# Patient Record
Sex: Male | Born: 1985 | Race: White | Hispanic: No | Marital: Single | Smoking: Former smoker
Health system: Southern US, Community
[De-identification: ages and names within clinical notes are randomized; demographics above are authoritative.]

## PROBLEM LIST (undated history)

## (undated) DIAGNOSIS — N289 Disorder of kidney and ureter, unspecified: Secondary | ICD-10-CM

---

## 2008-08-06 ENCOUNTER — Ambulatory Visit: Payer: Self-pay | Admitting: Internal Medicine

## 2008-08-09 ENCOUNTER — Ambulatory Visit: Payer: Self-pay | Admitting: Internal Medicine

## 2008-08-15 ENCOUNTER — Ambulatory Visit: Payer: Self-pay | Admitting: Internal Medicine

## 2008-08-21 ENCOUNTER — Encounter: Payer: Self-pay | Admitting: Internal Medicine

## 2008-08-25 ENCOUNTER — Ambulatory Visit: Payer: Self-pay | Admitting: Internal Medicine

## 2008-08-25 ENCOUNTER — Emergency Department: Payer: Self-pay | Admitting: Emergency Medicine

## 2008-09-22 ENCOUNTER — Ambulatory Visit: Payer: Self-pay | Admitting: Urology

## 2008-10-01 ENCOUNTER — Ambulatory Visit: Payer: Self-pay | Admitting: Internal Medicine

## 2008-10-02 ENCOUNTER — Emergency Department: Payer: Self-pay | Admitting: Emergency Medicine

## 2008-10-11 ENCOUNTER — Ambulatory Visit: Payer: Self-pay | Admitting: Internal Medicine

## 2009-03-25 ENCOUNTER — Emergency Department: Payer: Self-pay | Admitting: Emergency Medicine

## 2011-10-22 ENCOUNTER — Emergency Department: Payer: Self-pay | Admitting: Emergency Medicine

## 2011-10-22 LAB — BASIC METABOLIC PANEL
Calcium, Total: 9.4 mg/dL (ref 8.5–10.1)
Chloride: 103 mmol/L (ref 98–107)
Co2: 29 mmol/L (ref 21–32)
Creatinine: 1.13 mg/dL (ref 0.60–1.30)
EGFR (African American): >60
EGFR (Non-African Amer.): >60
Potassium: 3.6 mmol/L (ref 3.5–5.1)
Sodium: 139 mmol/L (ref 136–145)

## 2011-10-22 LAB — URINALYSIS, COMPLETE
Bacteria: NONE SEEN
Bilirubin,UR: NEGATIVE
Glucose,UR: NEGATIVE mg/dL (ref 0–75)
Leukocyte Esterase: NEGATIVE
Specific Gravity: 1.013 (ref 1.003–1.030)
Squamous Epithelial: NONE SEEN
WBC UR: 1 /HPF (ref 0–5)

## 2011-10-22 LAB — CBC
HCT: 46.6 % (ref 40.0–52.0)
HGB: 16.3 g/dL (ref 13.0–18.0)
RBC: 4.94 10*6/uL (ref 4.40–5.90)

## 2011-11-10 ENCOUNTER — Emergency Department: Payer: Self-pay | Admitting: Emergency Medicine

## 2011-11-10 LAB — URINALYSIS, COMPLETE
Bacteria: NONE SEEN
Glucose,UR: NEGATIVE mg/dL (ref 0–75)
Hyaline Cast: 3
Ketone: NEGATIVE
Nitrite: NEGATIVE
Ph: 7 (ref 4.5–8.0)
Specific Gravity: 1.012 (ref 1.003–1.030)
Squamous Epithelial: NONE SEEN

## 2011-11-10 LAB — COMPREHENSIVE METABOLIC PANEL
Anion Gap: 10 (ref 7–16)
BUN: 11 mg/dL (ref 7–18)
Bilirubin,Total: 0.5 mg/dL (ref 0.2–1.0)
Calcium, Total: 9.5 mg/dL (ref 8.5–10.1)
Chloride: 105 mmol/L (ref 98–107)
Co2: 25 mmol/L (ref 21–32)
EGFR (African American): >60
EGFR (Non-African Amer.): >60
Potassium: 3.7 mmol/L (ref 3.5–5.1)
SGOT(AST): 27 U/L (ref 15–37)
SGPT (ALT): 27 U/L (ref 12–78)

## 2011-11-10 LAB — CBC
HCT: 44.4 % (ref 40.0–52.0)
HGB: 15.5 g/dL (ref 13.0–18.0)
MCH: 32.7 pg (ref 26.0–34.0)
MCHC: 34.9 g/dL (ref 32.0–36.0)
MCV: 94 fL (ref 80–100)
RBC: 4.73 10*6/uL (ref 4.40–5.90)
RDW: 13.4 % (ref 11.5–14.5)

## 2011-11-27 ENCOUNTER — Emergency Department: Payer: Self-pay | Admitting: Emergency Medicine

## 2011-11-27 LAB — CBC WITH DIFFERENTIAL/PLATELET
Basophil #: 0 10*3/uL (ref 0.0–0.1)
Eosinophil #: 0.1 10*3/uL (ref 0.0–0.7)
HCT: 42.8 % (ref 40.0–52.0)
HGB: 15.2 g/dL (ref 13.0–18.0)
MCV: 94 fL (ref 80–100)
Monocyte #: 0.6 x10 3/mm (ref 0.2–1.0)
Neutrophil %: 83.8 %
RBC: 4.56 10*6/uL (ref 4.40–5.90)
RDW: 13.2 % (ref 11.5–14.5)
WBC: 8.7 10*3/uL (ref 3.8–10.6)

## 2011-11-27 LAB — URINALYSIS, COMPLETE
Bilirubin,UR: NEGATIVE
Blood: NEGATIVE
Leukocyte Esterase: NEGATIVE
Nitrite: NEGATIVE
Ph: 9 (ref 4.5–8.0)
Protein: NEGATIVE
RBC,UR: 6 /HPF (ref 0–5)
Squamous Epithelial: <1

## 2011-11-27 LAB — BASIC METABOLIC PANEL
Anion Gap: 11 (ref 7–16)
BUN: 12 mg/dL (ref 7–18)
Co2: 21 mmol/L (ref 21–32)
Creatinine: 1.18 mg/dL (ref 0.60–1.30)
EGFR (African American): >60
Glucose: 112 mg/dL — ABNORMAL HIGH (ref 65–99)
Sodium: 138 mmol/L (ref 136–145)

## 2013-05-23 ENCOUNTER — Emergency Department: Payer: Self-pay | Admitting: Emergency Medicine

## 2014-05-01 ENCOUNTER — Ambulatory Visit
Admit: 2014-05-01 | Disposition: A | Discharge status: Home / Self Care | Payer: Self-pay | Attending: Urology | Admitting: Urology

## 2015-11-19 ENCOUNTER — Encounter: Payer: Self-pay | Admitting: *Deleted

## 2015-11-19 ENCOUNTER — Ambulatory Visit
Admission: EM | Admit: 2015-11-19 | Discharge: 2015-11-19 | Disposition: A | Discharge status: Home / Self Care | Payer: Managed Care, Other (non HMO) | Attending: Family Medicine | Admitting: Family Medicine

## 2015-11-19 DIAGNOSIS — B029 Zoster without complications: Secondary | ICD-10-CM

## 2015-11-19 HISTORY — DX: Disorder of kidney and ureter, unspecified: N28.9

## 2015-11-19 MED ORDER — FAMCICLOVIR 500 MG PO TABS: 500.0000 mg | ORAL_TABLET | Freq: Three times a day (TID) | ORAL | 0 refills | Status: DC

## 2015-11-19 MED ORDER — HYDROCODONE-ACETAMINOPHEN 5-325 MG PO TABS: 1.0000 | ORAL_TABLET | Freq: Four times a day (QID) | ORAL | 0 refills | Status: AC | PRN

## 2015-11-19 MED ORDER — FAMCICLOVIR 500 MG PO TABS: 500.0000 mg | ORAL_TABLET | Freq: Three times a day (TID) | ORAL | 0 refills | Status: AC

## 2015-11-19 NOTE — ED Triage Notes (Signed)
Rash extending from spine around left rib margin x1 week. Pt c/o pain and tenderness to area.

## 2015-11-19 NOTE — ED Provider Notes (Signed)
CSN: [id number]     Arrival date & time 11/19/15  1502 History   First MD Initiated Contact with Patient 11/19/15 1658     Chief Complaint  Patient presents with  . Rash   (Consider location/radiation/quality/duration/timing/severity/associated sxs/prior Treatment) HPI  This a 30 year old male who presents with a painful rash that extends from his thoracic spine at about the T12 level around to the posterior axillary line. There is only several lesions present that have already scabbed over. He states that about a week ago he began to have a sunburn type burning pain. Last night the pain became so severe that he was requesting his wife take him to the emergency room. The area is most painful when he first awakens in the morning and also upon recumbency at night. During the daytime he knows that the pain is present but is not as severe. He is able to control the pain with BC powders during the day. He has not been under any undue stress. He has not had shingles in the past.      Past Medical History:  Diagnosis Date  . Renal disorder    History reviewed. No pertinent surgical history. History reviewed. No pertinent family history. Social History  Substance Use Topics  . Smoking status: Former Games developermoker  . Smokeless tobacco: Former NeurosurgeonUser  . Alcohol use Yes    Review of Systems  Constitutional: Negative for activity change, appetite change, chills, fatigue and fever.  Skin: Positive for rash.  All other systems reviewed and are negative.   Allergies  Erythromycin  Home Medications   Prior to Admission medications   Medication Sig Start Date End Date Taking? Authorizing Provider  famciclovir (FAMVIR) 500 MG tablet Take 1 tablet (500 mg total) by mouth 3 (three) times daily. 11/19/15   Lutricia FeilWilliam P Camika Marsico, PA-C  HYDROcodone-acetaminophen (NORCO/VICODIN) 5-325 MG tablet Take 1-2 tablets by mouth every 6 (six) hours as needed. 11/19/15   Lutricia FeilWilliam P Analeese Andreatta, PA-C   Meds Ordered and  Administered this Visit  Medications - No data to display  BP 107/82 (BP Location: Left Arm)   Pulse 71   Temp 98.3 F (36.8 C) (Oral)   Resp 16   Ht 5\' 7"  (1.702 m)   Wt 125 lb (56.7 kg)   SpO2 100%   BMI 19.58 kg/m  No data found.   Physical Exam  Constitutional: He is oriented to person, place, and time. He appears well-developed and well-nourished. No distress.  HENT:  Head: Normocephalic and atraumatic.  Right Ear: External ear normal.  Left Ear: External ear normal.  Eyes: EOM are normal. Pupils are equal, round, and reactive to light.  Neck: Normal range of motion. Neck supple.  Pulmonary/Chest: Effort normal and breath sounds normal. No respiratory distress. He has no wheezes. He has no rales.  Musculoskeletal: Normal range of motion.  Lymphadenopathy:    He has no cervical adenopathy.  Neurological: He is alert and oriented to person, place, and time.  Skin: Skin is warm and dry. He is not diaphoretic.  Examination of his posterior chest wall shows several millimeter circular lesions in groups of 2 that have scabbed over the top. There is a mildly erythematous base. It is in a line from the T12 extending over that his posterior left back extending out to the posterior axillary line. There is no vesicles present and no excoriations.  Psychiatric: He has a normal mood and affect. His behavior is normal. Judgment and thought content normal.  Nursing note and vitals reviewed.   Urgent Care Course   Clinical Course     Procedures (including critical care time)  Labs Review Labs Reviewed - No data to display  Imaging Review No results found.   Visual Acuity Review  Right Eye Distance:   Left Eye Distance:   Bilateral Distance:    Right Eye Near:   Left Eye Near:    Bilateral Near:         MDM   1. Herpes zoster without complication    Discharge Medication List as of 11/19/2015  5:30 PM    START taking these medications   Details   HYDROcodone-acetaminophen (NORCO/VICODIN) 5-325 MG tablet Take 1-2 tablets by mouth every 6 (six) hours as needed., Starting Mon 11/19/2015, Print      Plan: 1. Test/x-ray results and diagnosis reviewed with patient 2. rx as per orders; risks, benefits, potential side effects reviewed with patient 3. Recommend supportive treatment with  not to allow his 3517-month-old to come in contact until they are completely healed. I told him that it is late starting him on an antiviral but I think it is worth a try to prevent postherpetic neuralgia  risk. He has has severe pain at nighttime I will also provide him with Vicodin for pain at nighttime. [facility] in the daytime or may use ibuprofen 600 mg 3 times a day with food. He is not improving he should follow-up with his primary care physician 4. F/u prn if symptoms worsen or don't improve     Lutricia FeilWilliam P Delmi Fulfer, PA-C 11/19/15 1742

## 2016-01-31 IMAGING — CT CT STONE STUDY
2 of 4 series · 17 of 46 positions shown, 19 images · non-contrast
Comparison: 11/27/2011

CLINICAL DATA: Right side renal calculi, right lower quadrant
abdominal pain for 2 months

EXAM:
CT ABDOMEN AND PELVIS WITHOUT CONTRAST
TECHNIQUE: Multidetector CT imaging of the abdomen and pelvis was performed
following the standard protocol without IV contrast.

[Series 2: soft tissue · axial · 0.61mm/px · z∈[-1044,-644]mm · 14 of 88 slices shown, 16 images]
[im 4/88  soft-tissue]
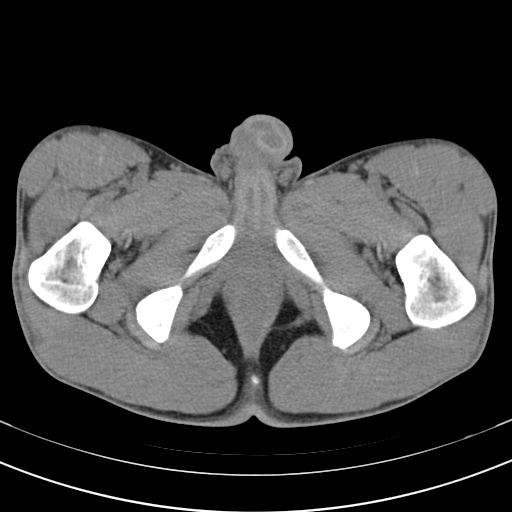
[im 4/88  bone]
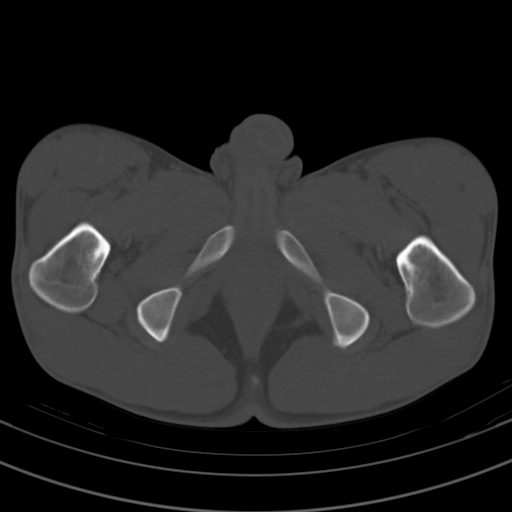
[im 12/88  soft-tissue]
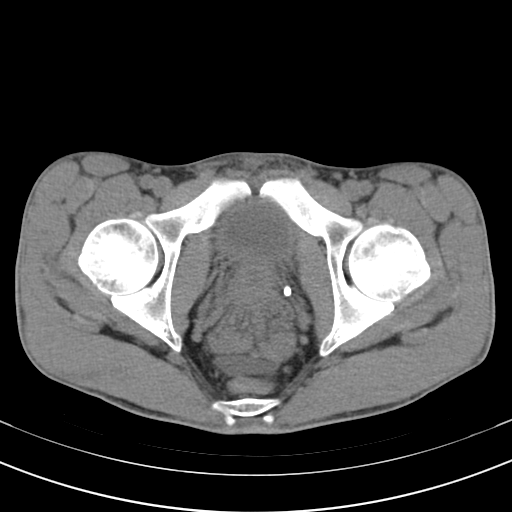
[im 16/88  soft-tissue]
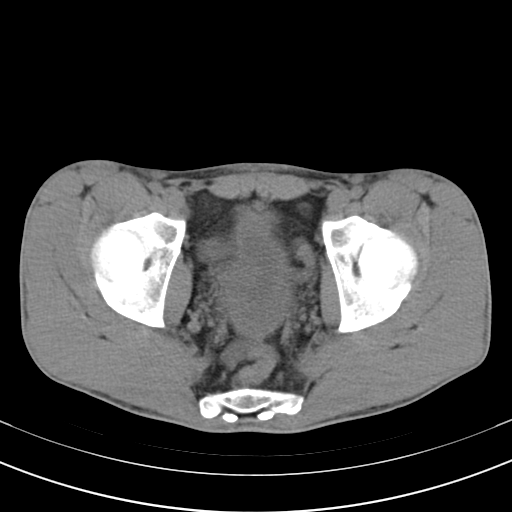
[im 23/88  soft-tissue]
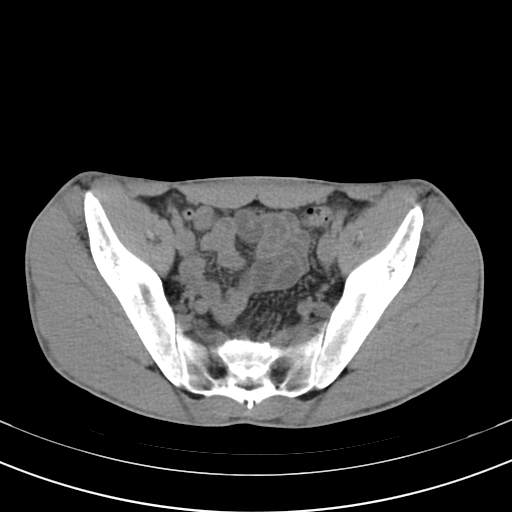
[im 31/88  soft-tissue]
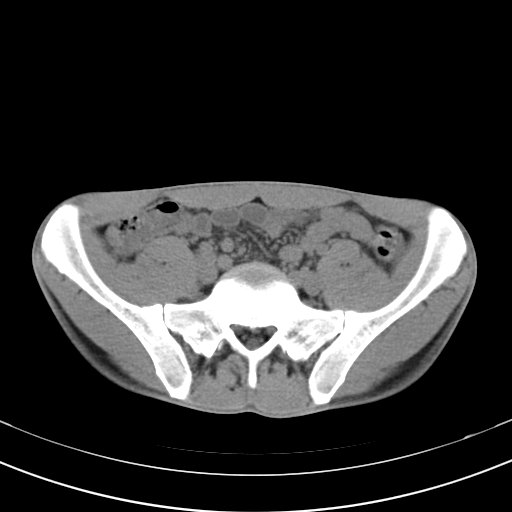
[im 35/88  soft-tissue]
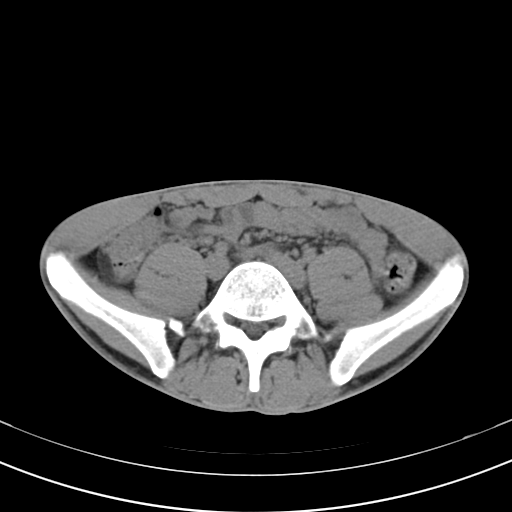
[im 42/88  soft-tissue]
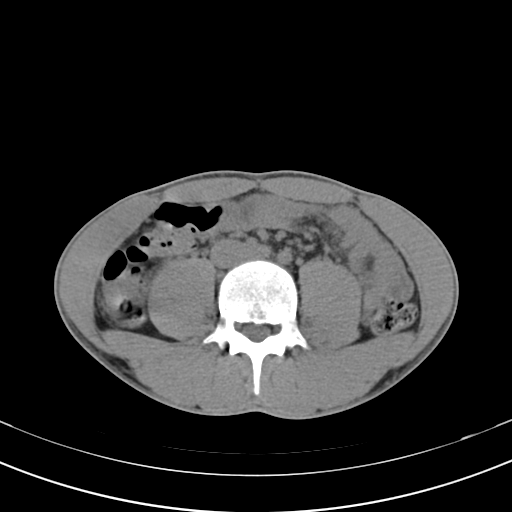
[im 46/88  soft-tissue]
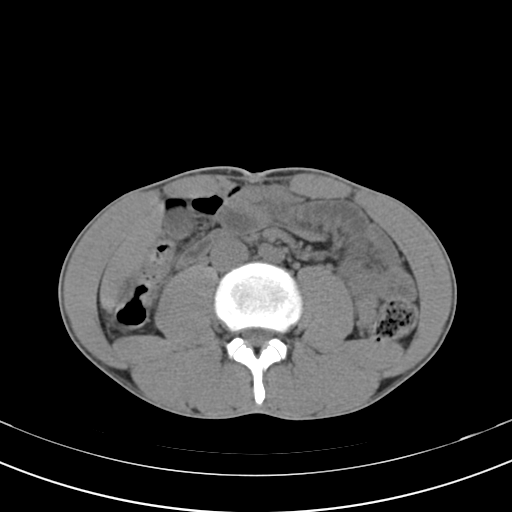
[im 53/88  soft-tissue]
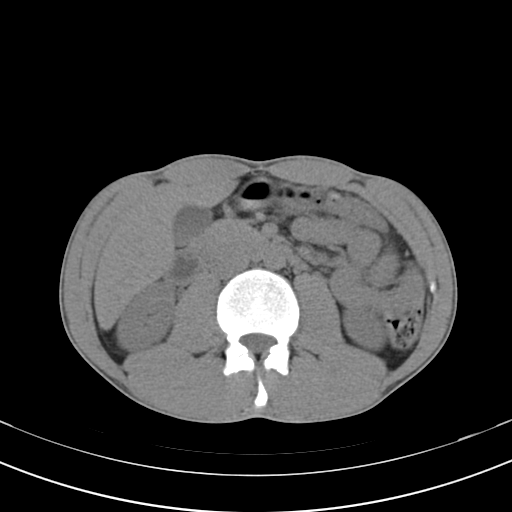
[im 53/88  bone]
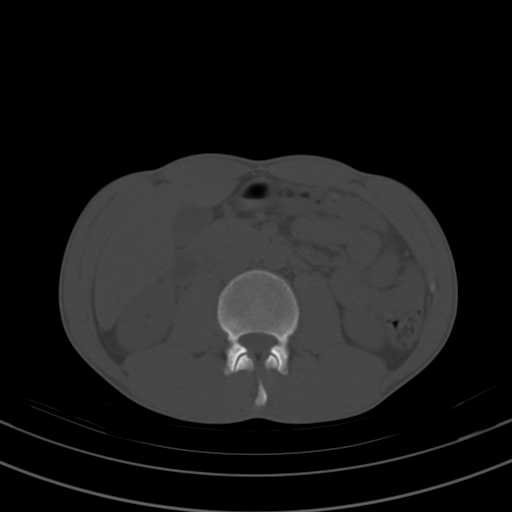
[im 57/88  soft-tissue]
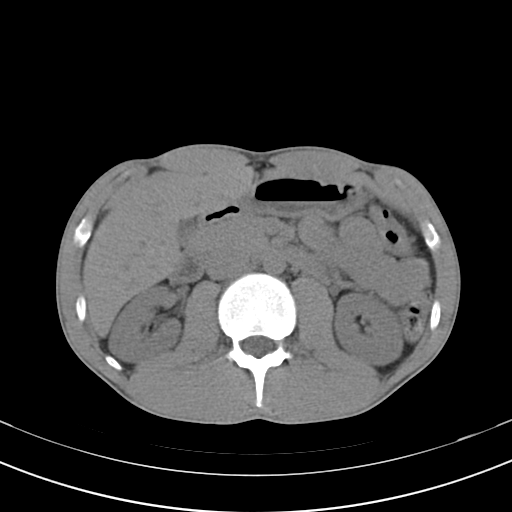
[im 65/88  soft-tissue]
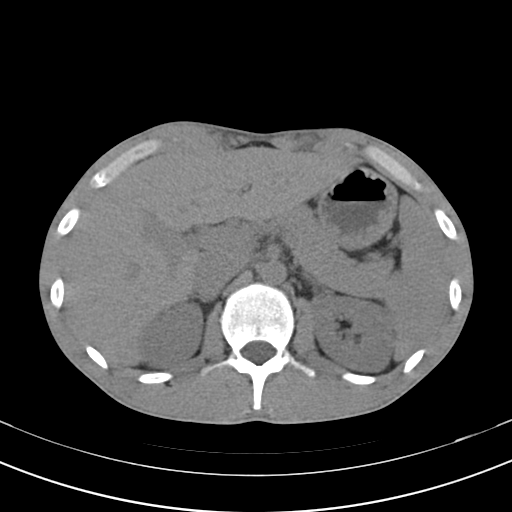
[im 72/88  soft-tissue]
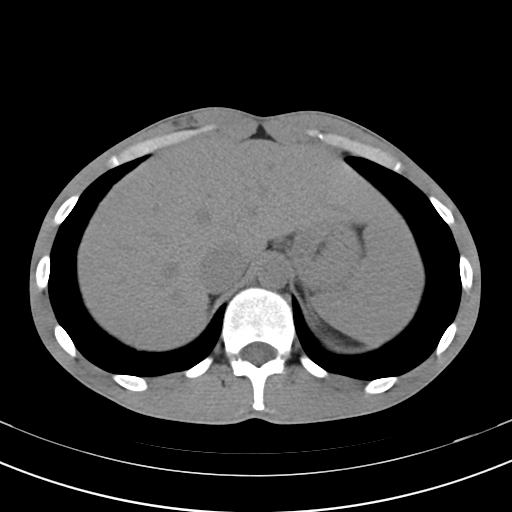
[im 76/88  soft-tissue]
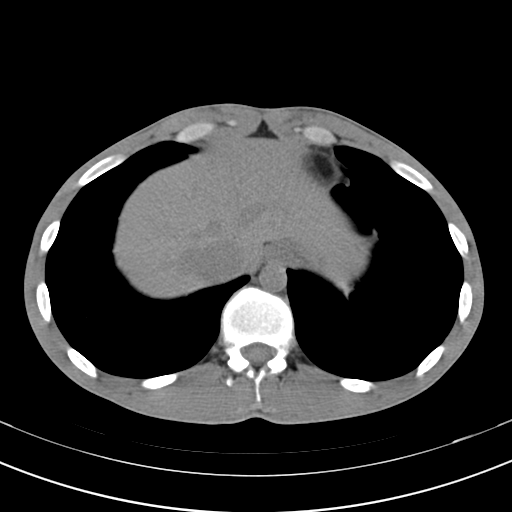
[im 84/88  soft-tissue]
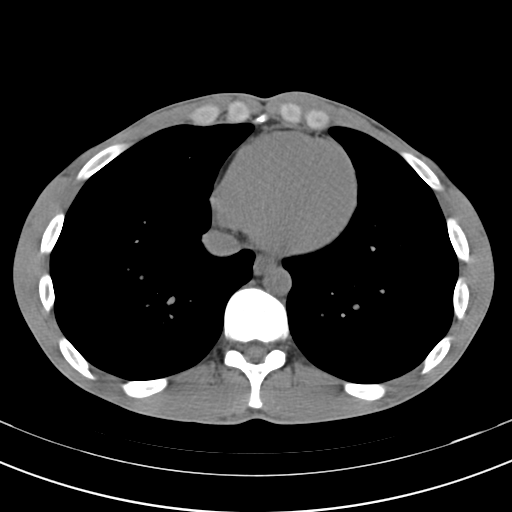

[Series 602: coronal · coronal · 0.85mm/px · 3 of 80 slices shown]
[im 27/80  soft-tissue]
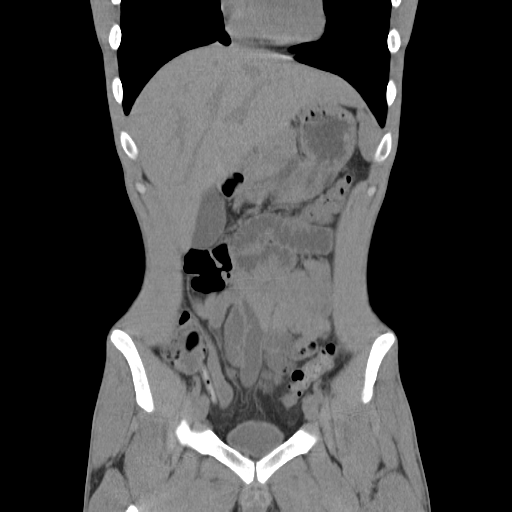
[im 36/80  soft-tissue]
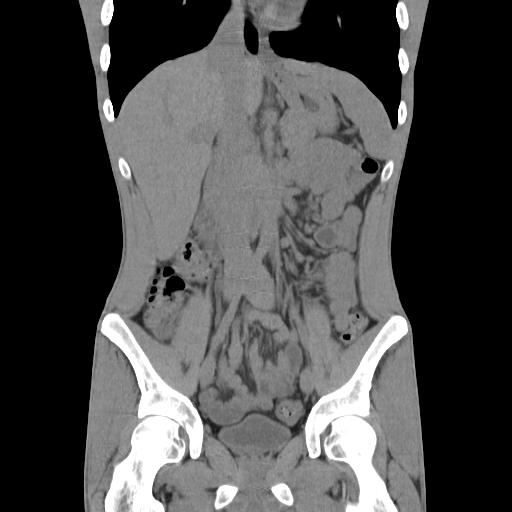
[im 44/80  soft-tissue]
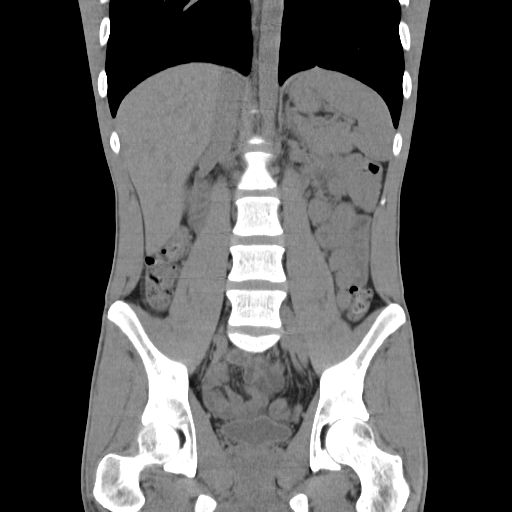

[17 of 46 positions shown; findings below may reference images not displayed]

FINDINGS: Lower chest:  Lung bases are clear.

Hepatobiliary: Unenhanced liver is unremarkable. Gallbladder appears
normal.

Pancreas: Normal

Spleen: Granulomatous calcifications are noted within the otherwise
normal-appearing spleen.

Adrenals/Urinary Tract: Numerous 1-2 mm nonobstructing bilateral
radiopaque renal calculi are identified without
hydroureteronephrosis. No radiopaque ureteral or bladder calculus.
No perinephric fluid or stranding.

Stomach/Bowel: No bowel wall thickening or focal segmental
dilatation is identified. Stomach appears normal. Appendix is
normal.

Vascular/Lymphatic: No aortic aneurysm.  No lymphadenopathy.

Other: Lobulated fluid density structure within the pelvis is again
noted which could represent a small amount of free fluid which is
generally considered abnormal in a male patient but may occasionally
be seen in the absence of trauma or other acute abnormalities. This
may indicate occult bowel pathology. Alternatively, this could
represent enteric duplication cyst or possibly lymphangioma given
chronicity of findings.

Musculoskeletal: No acute osseous abnormality.
IMPRESSION: No acute intra-abdominal or pelvic pathology.

Bilateral 1-2 mm nonobstructing renal calculi again identified.

## 2022-12-02 ENCOUNTER — Emergency Department
Admission: EM | Admit: 2022-12-02 | Discharge: 2022-12-02 | Disposition: A | Discharge status: Home / Self Care | Payer: Managed Care, Other (non HMO) | Attending: Emergency Medicine | Admitting: Emergency Medicine

## 2022-12-02 ENCOUNTER — Other Ambulatory Visit: Payer: Self-pay

## 2022-12-02 DIAGNOSIS — F489 Nonpsychotic mental disorder, unspecified: Secondary | ICD-10-CM

## 2022-12-02 DIAGNOSIS — F439 Reaction to severe stress, unspecified: Secondary | ICD-10-CM | POA: Insufficient documentation

## 2022-12-02 DIAGNOSIS — F419 Anxiety disorder, unspecified: Secondary | ICD-10-CM | POA: Insufficient documentation

## 2022-12-02 LAB — CBC
HCT: 44.4 % (ref 39.0–52.0)
Hemoglobin: 15.7 g/dL (ref 13.0–17.0)
MCH: 33.1 pg (ref 26.0–34.0)
MCHC: 35.4 g/dL (ref 30.0–36.0)
MCV: 93.5 fL (ref 80.0–100.0)
Platelets: 255 10*3/uL (ref 150–400)
RBC: 4.75 MIL/uL (ref 4.22–5.81)
RDW: 12.4 % (ref 11.5–15.5)
WBC: 10.2 10*3/uL (ref 4.0–10.5)
nRBC: 0 % (ref 0.0–0.2)

## 2022-12-02 LAB — COMPREHENSIVE METABOLIC PANEL
ALT: 19 U/L (ref 0–44)
AST: 22 U/L (ref 15–41)
Albumin: 4.8 g/dL (ref 3.5–5.0)
Alkaline Phosphatase: 61 U/L (ref 38–126)
Anion gap: 10 (ref 5–15)
BUN: 10 mg/dL (ref 6–20)
CO2: 28 mmol/L (ref 22–32)
Calcium: 9.6 mg/dL (ref 8.9–10.3)
Chloride: 102 mmol/L (ref 98–111)
Creatinine, Ser: 0.98 mg/dL (ref 0.61–1.24)
GFR, Estimated: >60 mL/min (ref >60)
Glucose, Bld: 95 mg/dL (ref 70–99)
Potassium: 3.5 mmol/L (ref 3.5–5.1)
Sodium: 140 mmol/L (ref 135–145)
Total Bilirubin: 1.1 mg/dL (ref <1.2)
Total Protein: 7.6 g/dL (ref 6.5–8.1)

## 2022-12-02 LAB — URINE DRUG SCREEN, QUALITATIVE (ARMC ONLY)
Amphetamines, Ur Screen: NOT DETECTED
Barbiturates, Ur Screen: NOT DETECTED
Benzodiazepine, Ur Scrn: NOT DETECTED
Cannabinoid 50 Ng, Ur ~~LOC~~: POSITIVE — AB
Cocaine Metabolite,Ur ~~LOC~~: NOT DETECTED
MDMA (Ecstasy)Ur Screen: NOT DETECTED
Methadone Scn, Ur: NOT DETECTED
Opiate, Ur Screen: NOT DETECTED
Phencyclidine (PCP) Ur S: NOT DETECTED
Tricyclic, Ur Screen: NOT DETECTED

## 2022-12-02 LAB — SALICYLATE LEVEL: Salicylate Lvl: <7 mg/dL — ABNORMAL LOW (ref 7.0–30.0)

## 2022-12-02 LAB — ACETAMINOPHEN LEVEL: Acetaminophen (Tylenol), Serum: <10 ug/mL — ABNORMAL LOW (ref 10–30)

## 2022-12-02 LAB — ETHANOL: Alcohol, Ethyl (B): <10 mg/dL (ref <10)
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
## 2022-12-02 NOTE — Discharge Instructions (Addendum)
You have been seen in the emergency department for a  psychiatric concern. You have been evaluated both medically as well as psychiatrically. Please follow-up with your outpatient resources provided. Return to the emergency department for any worsening symptoms, or any thoughts of hurting yourself or anyone else so that we may attempt to help you. 

## 2022-12-02 NOTE — ED Notes (Signed)
VOL/  PENDING  CONSULT 

## 2022-12-02 NOTE — ED Notes (Signed)
Contracts for safety while in the hospital.

## 2022-12-02 NOTE — ED Provider Notes (Signed)
-----------------------------------------   6:45 PM on 12/02/2022 ----------------------------------------- Patient has been seen by TTS, currently awaiting psychiatric evaluation however the patient states he has been here all day (over 9 hours at this point) and he is tired of waiting.  States he will follow-up with [facility] tomorrow on his own.  Denies SI or HI is not under an IVC.  Patient's workup in the emergency department is reassuring including a normal chemistry negative ethanol salicylate acetaminophen normal CBC and a reassuring urine drug screen.  We will discharge home per his wishes.   Minna Antis, MD 12/02/22 [phone]

## 2022-12-02 NOTE — ED Provider Notes (Signed)
[facility] Provider Note    Event Date/Time   First MD Initiated Contact with Patient 12/02/22 1028     (approximate)   History   Psychiatric Evaluation   HPI  Jimmy Stanley is a 36 y.o. male who presents to the emergency department for help with mental health.  He states that he has noticed that he is less motivation recently.  He additionally states that he will sometimes cry.  He has first had symptoms a few years ago but has not spoken to anybody about it.  Patient states that he is under a lot of stress.     Physical Exam   Triage Vital Signs: ED Triage Vitals  Encounter Vitals Group     BP 12/02/22 0945 122/82     Systolic BP Percentile --      Diastolic BP Percentile --      Pulse Rate 12/02/22 0943 70     Resp 12/02/22 0943 18     Temp 12/02/22 0943 98 F (36.7 C)     Temp Source 12/02/22 0943 Oral     SpO2 12/02/22 0943 100 %     Weight 12/02/22 0944 125 lb (56.7 kg)     Height 12/02/22 0944 5\' 7"  (1.702 m)     Head Circumference --      Peak Flow --      Pain Score 12/02/22 0943 0     Pain Loc --      Pain Education --      Exclude from Growth Chart --     Most recent vital signs: Vitals:   12/02/22 0943 12/02/22 0945  BP:  122/82  Pulse: 70   Resp: 18   Temp: 98 F (36.7 C)   SpO2: 100%    General: Awake, alert, oriented. CV:  Good peripheral perfusion. Regular rate and rhythm. Resp:  Normal effort. Lungs clear. Abd:  No distention.    ED Results / Procedures / Treatments   Labs (all labs ordered are listed, but only abnormal results are displayed) Labs Reviewed  SALICYLATE LEVEL - Abnormal; Notable for the following components:      Result Value   Salicylate Lvl <7.0 (*)    All other components within normal limits  ACETAMINOPHEN LEVEL - Abnormal; Notable for the following components:   Acetaminophen (Tylenol), Serum <10 (*)    All other components within normal limits  URINE DRUG SCREEN, QUALITATIVE  ([facility] ONLY) - Abnormal; Notable for the following components:   Cannabinoid 50 Ng, Ur D[place] POSITIVE (*)    All other components within normal limits  COMPREHENSIVE METABOLIC PANEL  ETHANOL  CBC     EKG  None   RADIOLOGY None   PROCEDURES:  Critical Care performed: No   MEDICATIONS ORDERED IN ED: Medications - No data to display   IMPRESSION / MDM / ASSESSMENT AND PLAN / ED COURSE  I reviewed the triage vital signs and the nursing notes.                              Differential diagnosis includes, but is not limited to, psychiatric illness, drug induced mood disorder.  Patient's presentation is most consistent with acute presentation with potential threat to life or bodily function.   Patient presented to the emergency department today because of concerns for mental health.  On exam patient is calm.  Will have patient be seen by psychiatry.  Denies any medical complaints.  The patient has been placed in psychiatric observation due to the need to provide a safe environment for the patient while obtaining psychiatric consultation and evaluation, as well as ongoing medical and medication management to treat the patient's condition.  The patient has not been placed under full IVC at this time.        FINAL CLINICAL IMPRESSION(S) / ED DIAGNOSES   Final diagnoses:  Mental health problem      Note:  This document was prepared using Dragon voice recognition software and may include unintentional dictation errors.    Phineas Semen, MD 12/02/22 [phone]

## 2022-12-02 NOTE — ED Notes (Signed)
Pt provided with lunch tray. Tray placed at bedside. Pt went back to sleep.

## 2022-12-02 NOTE — ED Notes (Signed)
Pt belongings:  1 pair of black boots 1 pair of black socks 1 pair of jeans 1 black shirt 1 black long sleeved undershirt 1 gray beanie 1 brown belt 1 black cell phone [employer], cigarette, airpods, and a can of dip

## 2022-12-02 NOTE — ED Notes (Signed)
Pt notified NT that he is ready to go and unwilling to wait for Psych. EDP notified.

## 2022-12-02 NOTE — ED Notes (Signed)
Pt voiced he is ready to go home as he has been here all morning. This RN let Pt know we are awaiting a Provider to come see him.

## 2022-12-02 NOTE — ED Triage Notes (Signed)
Pt here asking for help with his mental health. Pt states he his health has not been ok for the past couple of month and he just wants some help. Pt does not take medications. Pt denies wanting to hurt himself or others. Pt calm and cooperative in triage.
